# Patient Record
Sex: Male | Born: 2000 | Hispanic: No | Marital: Single | Smoking: Never smoker
Health system: Southern US, Community
[De-identification: ages and names within clinical notes are randomized; demographics above are authoritative.]

## PROBLEM LIST (undated history)

## (undated) DIAGNOSIS — J309 Allergic rhinitis, unspecified: Secondary | ICD-10-CM

## (undated) DIAGNOSIS — R062 Wheezing: Secondary | ICD-10-CM

## (undated) HISTORY — DX: Allergic rhinitis, unspecified: J30.9

## (undated) HISTORY — DX: Wheezing: R06.2

---
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
## 2018-02-28 ENCOUNTER — Ambulatory Visit (INDEPENDENT_AMBULATORY_CARE_PROVIDER_SITE_OTHER): Payer: Self-pay | Admitting: Licensed Clinical Social Worker

## 2018-02-28 ENCOUNTER — Encounter: Payer: Self-pay | Admitting: Pediatrics

## 2018-02-28 ENCOUNTER — Ambulatory Visit (INDEPENDENT_AMBULATORY_CARE_PROVIDER_SITE_OTHER): Payer: Medicaid Other | Admitting: Pediatrics

## 2018-02-28 VITALS — BP 114/70 | HR 65 | Ht 70.25 in | Wt 165.6 lb

## 2018-02-28 DIAGNOSIS — Z68.41 Body mass index (BMI) pediatric, 5th percentile to less than 85th percentile for age: Secondary | ICD-10-CM

## 2018-02-28 DIAGNOSIS — Z0000 Encounter for general adult medical examination without abnormal findings: Secondary | ICD-10-CM

## 2018-02-28 DIAGNOSIS — M25562 Pain in left knee: Secondary | ICD-10-CM

## 2018-02-28 DIAGNOSIS — M25561 Pain in right knee: Secondary | ICD-10-CM | POA: Diagnosis not present

## 2018-02-28 DIAGNOSIS — Z113 Encounter for screening for infections with a predominantly sexual mode of transmission: Secondary | ICD-10-CM | POA: Diagnosis not present

## 2018-02-28 DIAGNOSIS — G8929 Other chronic pain: Secondary | ICD-10-CM | POA: Diagnosis not present

## 2018-02-28 DIAGNOSIS — Z1331 Encounter for screening for depression: Secondary | ICD-10-CM

## 2018-02-28 LAB — POCT RAPID HIV: Rapid HIV, POC: NEGATIVE

## 2018-02-28 NOTE — Progress Notes (Signed)
Adolescent Well Care Visit Harold Fernandez is a 17 y.o. male who is here for well care.    PCP:  Clifton Custard, MD   History was provided by the patient.  Current Issues: Current concerns include knee pain.   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 Plans [employer] next year for construction management.  Confidential Social History: Tobacco?  no Secondhand smoke exposure?  no Drugs/ETOH?  no  Sexually Active?  no   Pregnancy Prevention: abstinence, condoms  Safe at home, in school & in relationships?  Yes Safe to self?  Yes   Screenings: Patient has a dental home: yes  The patient completed the Rapid Assessment for Adolescent Preventive Services screening questionnaire and the following topics were identified as risk factors and discussed: helmet use  In addition, the following topics were discussed as part of anticipatory guidance tobacco use, marijuana use, condom use, birth control and alcohol avoidance.  PHQ-9 completed and results indicated no signs of depression  Physical Exam:  Vitals:   02/28/18 1506  BP: 114/70  Pulse: 65  SpO2: 96%  Weight: 165 lb 9.6 oz (75.1 kg)  Height: 5' 10.25" (1.784 m)   BP 114/70 (BP Location: Right Arm, Patient Position: Sitting, Cuff Size: Normal)   Pulse 65   Ht 5' 10.25" (1.784 m)   Wt 165 lb 9.6 oz (75.1 kg)   SpO2 96%   BMI 23.59 kg/m  Body mass index: body  mass index is 23.59 kg/m.    Hearing Screening   Method: Audiometry   125Hz  250Hz  500Hz  1000Hz  2000Hz  3000Hz  4000Hz  6000Hz  8000Hz   Right ear:   40 20 20  20     Left ear:   20 20 20  20       Visual Acuity Screening   Right eye Left eye Both eyes  Without correction: 10/10 10/10 10/10   With correction:       General Appearance:   alert, oriented, no acute distress and well nourished  HENT: Normocephalic, no obvious abnormality, conjunctiva clear  Mouth:   Normal appearing teeth, no obvious discoloration, dental caries, or dental caps  Neck:   Supple; thyroid: no enlargement, symmetric, no tenderness/mass/nodules  Chest Normal male  Lungs:   Clear to auscultation bilaterally, normal work of breathing  Heart:   Regular rate and rhythm, S1 and S2 normal, no murmurs;   Abdomen:   Soft, non-tender, no mass, or organomegaly  GU normal male genitals, no testicular masses or hernia, Tanner stage V, very mild left varicocele  Musculoskeletal:   Tone and strength strong and symmetrical, all extremities, normal exam of both knees no swelling, tenderness or effusions.  Normal ROM, anterior drawer, and medial/lateral stress testing.              Lymphatic:   No cervical adenopathy  Skin/Hair/Nails:   Skin warm, dry and intact, no rashes, no bruises or petechiae  Neurologic:   Strength, gait, and coordination normal and age-appropriate  Assessment and Plan:   Healthy 17 year old male.    Very mild left varicocele - discussed with patient  Chronic bilateral knee pain - normal exam today in clinic.  Pain described as along the upper aspect of both patellas.  Possible patellofemoral sydrome vs. Tendonitis.  Recommend quadriceps stretching.  Return precautions reviewed.    BMI is appropriate for age  Hearing screening result:normal except for 40 db at 500 Hz in the right ear, no hearing concerns.  Plan to rescreen in 1 year Vision screening result: normal   Return today (on 02/28/2018) for  17 year old PE in 1 year with Dr. Luna FuseEttefagh.Clifton Custard.  Kate Scott Ettefagh, MD

## 2018-02-28 NOTE — BH Specialist Note (Signed)
Integrated Behavioral Health Initial Visit  MRN: [id number] Name: Harold Fernandez  Number of Integrated Behavioral Health Clinician visits:: 1/6 Session Start time: 3:17  Session End time: 3:23 Total time: 6 mins; no charge to brief visit  Type of Service: Integrated Behavioral Health- Individual/Family Interpretor:No. Interpretor Name and Language: n/a   Warm Hand Off Completed.       SUBJECTIVE: Harold Fernandez is a 17 y.o. male accompanied by self Patient was referred by Dr. Luna FuseEttefagh for PHQ Review.  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

## 2018-03-01 LAB — C. TRACHOMATIS/N. GONORRHOEAE RNA
C. TRACHOMATIS RNA, TMA: NOT DETECTED
N. GONORRHOEAE RNA, TMA: NOT DETECTED

## 2018-03-10 ENCOUNTER — Other Ambulatory Visit: Payer: Self-pay

## 2018-03-10 ENCOUNTER — Encounter: Payer: Self-pay | Admitting: Pediatrics

## 2018-03-10 ENCOUNTER — Ambulatory Visit (INDEPENDENT_AMBULATORY_CARE_PROVIDER_SITE_OTHER): Payer: Medicaid Other | Admitting: Pediatrics

## 2018-03-10 VITALS — Temp 97.4°F | Wt 164.6 lb

## 2018-03-10 DIAGNOSIS — J029 Acute pharyngitis, unspecified: Secondary | ICD-10-CM | POA: Diagnosis not present

## 2018-03-10 LAB — POCT RAPID STREP A (OFFICE): RAPID STREP A SCREEN: NEGATIVE

## 2018-03-10 NOTE — Progress Notes (Signed)
Subjective:     Harold Fernandez, is a 17 y.o. male   History provider by patient No interpreter necessary.  Chief Complaint  Patient presents with  . Sore Throat    UTD shots and urine sti testing. c/o throat pain since Friday, difficult to eat, had trouble swallowing saliva. using cepacol.   . Fever    felt hot first day of illness. using motrin.     HPI: Harold Fernandez presents for evaluation of sore throat. Reports he was doing well until Friday 6/14 when he started to have throat pain after coming home from work. The pain was worst that night and the next day he went to a pharmacy and was instructed to start use of ibuprofen and Cepacol to help with pain. He has continued those during the weekend once daily and will feel some relief, however the pain returns. Overall feels it is better than Friday. Possibly had subjective temp on Friday evening but none since then. Has mild dry cough, but no rhinorrhea, congestion, rash, diarrhea, vomiting or other symptoms. No known sick contacts. 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 No h/o sexual activity.   Review of Systems  Constitutional: Negative for activity change, appetite change and fever.  HENT: Positive for sore throat. Negative for congestion, mouth sores, rhinorrhea and sneezing.   Eyes: Negative for redness.  Respiratory: Positive for cough (mild). Negative for shortness of breath.   Gastrointestinal: Negative for abdominal distention, abdominal pain, diarrhea and vomiting.  Genitourinary: Negative for decreased urine volume.  Musculoskeletal: Negative for neck stiffness.  Skin: Negative for rash.  Neurological: Negative for headaches.     Patient's history was reviewed and updated as appropriate: allergies, current medications, past family history, past medical history, past social history, past surgical  history and problem list.     Objective:     Temp (!) 97.4 F (36.3 C) (Temporal)   Wt 164 lb 9.6 oz (74.7 kg)   BMI 23.45 kg/m   Physical Exam  Constitutional: He is oriented to person, place, and time. He appears well-developed and well-nourished. No distress.  HENT:  Head: Normocephalic.  Right Ear: Tympanic membrane and ear canal normal.  Left Ear: Tympanic membrane and ear canal normal.  Mouth/Throat: Mucous membranes are normal. No oral lesions. Posterior oropharyngeal erythema present. No tonsillar exudate.  Eyes: Pupils are equal, round, and reactive to light. EOM are normal.  Neck: Normal range of motion. Neck supple.  Cardiovascular: Normal rate, regular rhythm and intact distal pulses.  Pulmonary/Chest: Effort normal and breath sounds normal. He has no wheezes.  Lymphadenopathy:    He has no cervical adenopathy.  Neurological: He is alert and oriented to person, place, and time.  Skin: Skin is warm. Capillary refill takes less than 2 seconds.       Assessment & Plan:   1. Pharyngitis, unspecified etiology: Mild pharyngeal erythema with no exudate or LAD. Lungs CTAB and no other URI symptoms. Rapid strep testing negative; with lack of fever or other findings, do not feel culture is necessary. Possible viral pharyngitis which is resolving, versus irritation from particle exposure at work. Continue supportive care with ibuprofen/Tylenol prn and pain control measures. Return precautions for new/worsening pain, difficulty with PO, fevers or respiratory symptoms reviewed.  - POCT rapid strep A (negative)  Return if symptoms worsen or fail to improve.  [employer]  Alinda Money, MD

## 2018-03-10 NOTE — Patient Instructions (Addendum)
Your strep test was negative today, which means you do not have strep throat. You can continue to take the ibuprofen 400 mg every 6-8 hours as needed for pain. You can also take Tyelnol 500 mg every 6 hours as needed between doses of ibuprofen if needed to help. Make sure to drink plenty of water while you are taking these medications and make sure you do not get dehydrated. If you start to have fevers, have worsening pain, feel like there is drainage in your throat, have trouble drinking any fluids or any other new symptoms, please call us to be seen again.   Sore Throat A sore throat is pain, burning, irritation, or scratchiness in the throat. When you have a sore throat, you may feel pain or tenderness in your throat when you swallow or talk. Many things can cause a sore throat, including:  An infection.  Seasonal allergies.  Dryness in the air.  Irritants, such as smoke or pollution.  Gastroesophageal reflux disease (GERD).  A tumor.  A sore throat is often the first sign of another sickness. It may happen with other symptoms, such as coughing, sneezing, fever, and swollen neck glands. Most sore throats go away without medical treatment. Follow these instructions at home:  Take over-the-counter medicines only as told by your health care provider.  Drink enough fluids to keep your urine clear or pale yellow.  Rest as needed.  To help with pain, try: ? Sipping warm liquids, such as broth, herbal tea, or warm water. ? Eating or drinking cold or frozen liquids, such as frozen ice pops. ? Gargling with a salt-water mixture 3-4 times a day or as needed. To make a salt-water mixture, completely dissolve -1 tsp of salt in 1 cup of warm water. ? Sucking on hard candy or throat lozenges. ? Putting a cool-mist humidifier in your bedroom at night to moisten the air. ? Sitting in the bathroom with the door closed for 5-10 minutes while you run hot water in the shower.  Do not use any  tobacco products, such as cigarettes, chewing tobacco, and e-cigarettes. If you need help quitting, ask your health care provider. Contact a health care provider if:  You have a fever for more than 2-3 days.  You have symptoms that last (are persistent) for more than 2-3 days.  Your throat does not get better within 7 days.  You have a fever and your symptoms suddenly get worse. Get help right away if:  You have difficulty breathing.  You cannot swallow fluids, soft foods, or your saliva.  You have increased swelling in your throat or neck.  You have persistent nausea and vomiting. This information is not intended to replace advice given to you by your health care provider. Make sure you discuss any questions you have with your health care provider. Document Released: 10/18/2004 Document Revised: 05/06/2016 Document Reviewed: 07/01/2015 Elsevier Interactive Patient Education  [employer]2018 Elsevier Inc.

## 2018-07-02 ENCOUNTER — Other Ambulatory Visit: Payer: Self-pay

## 2018-07-02 ENCOUNTER — Encounter: Payer: Self-pay | Admitting: Pediatrics

## 2018-07-02 ENCOUNTER — Ambulatory Visit (INDEPENDENT_AMBULATORY_CARE_PROVIDER_SITE_OTHER): Payer: Medicaid Other | Admitting: Pediatrics

## 2018-07-02 VITALS — Temp 98.6°F | Wt 172.0 lb

## 2018-07-02 DIAGNOSIS — Z23 Encounter for immunization: Secondary | ICD-10-CM | POA: Diagnosis not present

## 2018-07-02 DIAGNOSIS — L739 Follicular disorder, unspecified: Secondary | ICD-10-CM

## 2018-07-02 MED ORDER — MUPIROCIN 2 % EX OINT: 1.0000 "application " | TOPICAL_OINTMENT | Freq: Three times a day (TID) | CUTANEOUS | 0 refills | Status: AC

## 2018-07-02 NOTE — Patient Instructions (Signed)

## 2018-07-02 NOTE — Progress Notes (Signed)
Subjective:    Tannor is a 17 y.o. old male here with his mother for other (bump on the back of his neck since last Friday, at this time it does not hurt or itch ) .    No interpreter necessary.  HPI   This 17 year old presents with knot on the back of his neck that was originally painful but now is not. It has drained a little pus. No itching. No fever.   Review of Systems  Constitutional: Negative for activity change and fever.  Skin: Positive for rash.    History and Problem List: Concepcion has Allergic conjunctivitis and rhinitis; Acne vulgaris; and History of ankle sprain on their problem list.  Taz  has a past medical history of Allergic rhinitis (2006) and Wheezing (09/2004).  Immunizations needed: Flu shot today.      Objective:    Temp 98.6 F (37 C) (Oral)   Wt 172 lb (78 kg)   BMI 24.50 kg/m  Physical Exam  Constitutional: He appears well-developed. No distress.  Cardiovascular: Normal rate and regular rhythm.  No murmur heard. Pulmonary/Chest: Effort normal and breath sounds normal.  Skin:  indurated but NT and non fluctuant 1 cm mass posterior neck in hair line. Dark scab on surface       Assessment and Plan:   Daishon is a 17 y.o. old male with boil that has drained..  1. Folliculitis Discussed return precautions.   Warm Compresses.   - mupirocin ointment (BACTROBAN) 2 %; Apply 1 application topically 3 (three) times daily for 7 days.  Dispense: 22 g; Refill: 0  2. Need for vaccination Counseling provided on all components of vaccines given today and the importance of receiving them. All questions answered.Risks and benefits reviewed and guardian consents.  - Flu Vaccine QUAD 36+ mos IM    Return if symptoms worsen or fail to improve.  Kalman Jewels, MD

## 2018-07-08 ENCOUNTER — Ambulatory Visit: Payer: Medicaid Other

## 2018-10-09 NOTE — Progress Notes (Signed)
Subjective:    Harold Fernandez is a 18 y.o. old male here with his father for drug testing and STI screening (Parents of patient would like patient to have drug and STD testing.) .  Last well teen check was in June 2019.  HPI Patient comes with father for STD and drug testing. Interview conducted partially with father and partially alone with patient.  [phone] home phone, [phone] cell phone of patient.  If no positive results, call home phone first. If anything is positive, call patient's cell phone first.   Patient reports that parents are demanding drug and STI testing in order for him to stay living under their roof. Father walked in on him during an intimate moment with a male partner and also saw messages on the patient's phone re: marijuana.   Patient reports that he was last sexually active about 2-3 months ago. Has only had one lifetime male partner, no concerns for STDs in that individual. Patient reports no discharge, pain with urination, no rash. No lesions or lumps in genitals.  No oral pain or ulcers. No fevers, chills, headaches, difficulty breathing. No condom use at tim of sex. Has never has STD testing.  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 Last use 6 mo before. Maybe uses a couple of times a year. No cocaine, meth, heroin, or other drug use. Alcohol maybe once every couple of months. No vaping.    Review of Systems negative except where noted above  History and Problem List: Harold Fernandez has Allergic conjunctivitis and rhinitis; Acne vulgaris; and History of ankle sprain on their problem list.  Harold Fernandez  has a past medical history of Allergic rhinitis (2006) and Wheezing (09/2004).  Immunizations needed: none     Objective:    BP 128/76   Pulse 64   Temp 98.1 F (36.7 C) (Oral)   Wt 178 lb (80.7 kg)   BMI 25.36 kg/m  Physical Exam Vitals signs and nursing note reviewed. Exam conducted with a chaperone present.  Constitutional:    General: He is not in acute distress.    Appearance: He is normal weight. He is not ill-appearing.  HENT:     Head: Normocephalic.     Nose: Nose normal.     Mouth/Throat:     Mouth: Mucous membranes are moist.     Pharynx: No oropharyngeal exudate or posterior oropharyngeal erythema.  Eyes:     General:        Right eye: No discharge.        Left eye: No discharge.     Conjunctiva/sclera: Conjunctivae normal.     Pupils: Pupils are equal, round, and reactive to light.  Neck:     Musculoskeletal: Normal range of motion.  Cardiovascular:     Rate and Rhythm: Normal rate and regular rhythm.     Heart sounds: No murmur.  Pulmonary:     Effort: Pulmonary effort is normal.     Breath sounds: Normal breath sounds. No wheezing, rhonchi or rales.  Abdominal:     General: Abdomen is flat. Bowel sounds are normal.     Tenderness: There is no abdominal tenderness.  Genitourinary:    Penis: Normal and circumcised. No erythema, tenderness, discharge, swelling or lesions.      Scrotum/Testes: Normal.        Right: Mass, tenderness, swelling, testicular hydrocele or varicocele not present.        Left: Mass, tenderness, swelling, testicular hydrocele or varicocele not  present.     Epididymis:     Right: Normal. Not enlarged. No tenderness.     Left: Normal. Not enlarged. No tenderness.  Lymphadenopathy:     Cervical: No cervical adenopathy.  Skin:    General: Skin is warm.     Capillary Refill: Capillary refill takes less than 2 seconds.  Neurological:     General: No focal deficit present.     Mental Status: He is alert.  Psychiatric:        Mood and Affect: Mood normal.        Behavior: Behavior normal.        Thought Content: Thought content normal.        Assessment and Plan:     Harold Fernandez was seen today for drug testing and STI screening (Parents of patient would like patient to have drug and STD testing.) . Very low concern for STI based on history and exam, though will perform  testing as recommended. Confirmed that the patient would like to pursue drug testing at this time. Informed him that, as an adult, he has the right to confidentiality of all of his medical records. If no positive results, we are to call home phone number and have permission to update his parents. If any positive results, we are to call patient first so that he may notify his parents. Safe sex and drug use counseling provided. Condoms provided. Return for next well check.    Problem List Items Addressed This Visit    None    Visit Diagnoses    Encounter for drug screening    -  Primary   Relevant Orders   Pain Mgmt, Profile 1 w/o Conf, U   Counseling for sexually transmitted disease       Screen for STD (sexually transmitted disease)       Relevant Orders   C. trachomatis/N. gonorrhoeae RNA   HIV Antibody (routine testing w rflx)   RPR      Return if symptoms worsen or fail to improve, for next due annual physical .  Irene Shipper, MD

## 2018-10-10 ENCOUNTER — Encounter: Payer: Self-pay | Admitting: Pediatrics

## 2018-10-10 ENCOUNTER — Ambulatory Visit (INDEPENDENT_AMBULATORY_CARE_PROVIDER_SITE_OTHER): Payer: Medicaid Other | Admitting: Pediatrics

## 2018-10-10 VITALS — BP 128/76 | HR 64 | Temp 98.1°F | Wt 178.0 lb

## 2018-10-10 DIAGNOSIS — Z0283 Encounter for blood-alcohol and blood-drug test: Secondary | ICD-10-CM

## 2018-10-10 DIAGNOSIS — Z113 Encounter for screening for infections with a predominantly sexual mode of transmission: Secondary | ICD-10-CM | POA: Diagnosis not present

## 2018-10-10 DIAGNOSIS — Z708 Other sex counseling: Secondary | ICD-10-CM

## 2018-10-10 NOTE — Patient Instructions (Signed)
We will call you with results in the next few days. You can have your parents come in to get lab reports, if that is what they would like.

## 2018-10-11 LAB — PAIN MGMT, PROFILE 1 W/O CONF, U
Amphetamines: NEGATIVE ng/mL (ref <500)
Barbiturates: NEGATIVE ng/mL (ref <300)
Benzodiazepines: NEGATIVE ng/mL (ref <100)
Cocaine Metabolite: NEGATIVE ng/mL (ref <150)
Creatinine: 53 mg/dL
MARIJUANA METABOLITE: NEGATIVE ng/mL (ref <20)
Methadone Metabolite: NEGATIVE ng/mL (ref <100)
OPIATES: NEGATIVE ng/mL (ref <100)
OXIDANT: NEGATIVE ug/mL (ref <200)
OXYCODONE: NEGATIVE ng/mL (ref <100)
Phencyclidine: NEGATIVE ng/mL (ref <25)
pH: 6.7 (ref 4.5–9.0)

## 2018-10-11 LAB — C. TRACHOMATIS/N. GONORRHOEAE RNA
C. TRACHOMATIS RNA, TMA: NOT DETECTED
N. GONORRHOEAE RNA, TMA: NOT DETECTED

## 2018-10-12 LAB — RPR: RPR: NONREACTIVE

## 2018-10-12 LAB — HIV ANTIBODY (ROUTINE TESTING W REFLEX): HIV 1&2 Ab, 4th Generation: NONREACTIVE

## 2018-10-27 ENCOUNTER — Ambulatory Visit (INDEPENDENT_AMBULATORY_CARE_PROVIDER_SITE_OTHER): Payer: Medicaid Other | Admitting: Pediatrics

## 2018-10-27 ENCOUNTER — Encounter: Payer: Self-pay | Admitting: Pediatrics

## 2018-10-27 ENCOUNTER — Other Ambulatory Visit: Payer: Self-pay

## 2018-10-27 VITALS — Temp 98.3°F | Wt 178.4 lb

## 2018-10-27 DIAGNOSIS — M25562 Pain in left knee: Secondary | ICD-10-CM | POA: Diagnosis not present

## 2018-10-27 MED ORDER — IBUPROFEN 600 MG PO TABS: ORAL_TABLET | ORAL | 1 refills | Status: DC

## 2018-10-27 NOTE — Patient Instructions (Signed)

## 2018-10-27 NOTE — Progress Notes (Signed)
  Subjective:     Patient ID: Harold Fernandez, male   DOB: Jul 30, 2001, 18 y.o.   MRN: [id number]  HPI:  18 year old male in by himself.  Has had pain in left knee for past 10-14 days.  Denies any injury to area.  Points to top, lateral edge of patella as area of pain.  Has not had redness or swelling to area.  Able to walk and climb steps with no increase in pain.  Does not feel like knee is "giving out".  Occasionally plays soccer.  Is a Consulting civil engineer at [employer]   Review of Systems:  Non-contributory except as mentioned in HPI     Objective:   Physical Exam Vitals signs and nursing note reviewed.  Constitutional:      General: He is not in acute distress.    Appearance: Normal appearance. He is normal weight.  Musculoskeletal: Normal range of motion.        General: No swelling, tenderness, deformity or signs of injury.     Comments: Has full flexion and extension of knee.  Able to do squats and balance on one leg (left).  No patellar laxity.  Neurological:     Mental Status: He is alert.        Assessment:     Acute left knee pain- R/O ilial-tibial band syndrome     Plan:     Rx per orders for Motrin.  Take every 6 hours for next 3-4 days and then prn  Referral to Sports Medicine.   Gregor Hams, PPCNP-BC

## 2018-10-29 ENCOUNTER — Ambulatory Visit (INDEPENDENT_AMBULATORY_CARE_PROVIDER_SITE_OTHER): Payer: Medicaid Other | Admitting: Sports Medicine

## 2018-10-29 ENCOUNTER — Ambulatory Visit: Payer: Self-pay

## 2018-10-29 VITALS — BP 120/78 | Ht 70.5 in | Wt 178.0 lb

## 2018-10-29 DIAGNOSIS — M25562 Pain in left knee: Secondary | ICD-10-CM | POA: Diagnosis not present

## 2018-10-29 MED ORDER — DICLOFENAC SODIUM 75 MG PO TBEC: 75.0000 mg | DELAYED_RELEASE_TABLET | Freq: Two times a day (BID) | ORAL | 0 refills | Status: DC

## 2018-10-29 NOTE — Progress Notes (Signed)
HPI  CC: Left knee pain  Harold Fernandez is a 18 year old male who presents for left knee pain.  He states the pain is been present for around 1 week.  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  He does not do any deep leg workouts.  He states he is not member any [employer] inciting event for the knee.  He states that playing soccer does not bother it.  He states he is able to play a full game without any issue.  He states the pain is worse when he sits with his knee bent.  He states he notices it mostly when he sitting in class in a chair.  He states that extending his knee helps with the pain.  He states there was initially some swelling in the lateral aspect of his knee, but this is since gone away.  He states the pain has gotten significantly better.  He denies any numbness and tingling.  Denies any weakness.  He denies any locking or catching of his knee.  He has no prior injuries to that knee.  Past Injuries: None Past Surgeries: None Smoking: Denies Family Hx: Noncontributory  ROS: Per HPI; in addition no fever, no rash, no additional weakness, no additional numbness, no additional paresthesias, and no additional falls/injury.   Objective: BP 120/78   Ht 5' 10.5" (1.791 m)   Wt 178 lb (80.7 kg)   BMI 25.18 kg/m  Gen: Right-Hand Dominant. NAD, well groomed, a/o x3, normal affect.  CV: Well-perfused. Warm.  Resp: Non-labored.  Neuro: Sensation intact throughout. No gross coordination deficits.  Gait: Nonpathologic posture, unremarkable stride without signs of limp or balance issues.  Left knee exam: No erythema, warmth, swelling noted.  Tenderness palpation over the superolateral aspect of the patella.  Full range of motion of the knee in flexion extension.  No pain with range of motion.  Strength out of 5 throughout testing.  Negative Lockman, negative posterior drawer, negative varus stress test.  Some laxity noticed and valgus stress test.  Negative McMurray  test.  ULTRASOUND: Knee, left Diagnostic limited ultrasound imaging obtained of patient's left knee.  - Quadriceps tendon: No appreciated signs of tearing, edema, or calcification.  Fluid noted in the lateral aspect of the suprapatellar pouch with small calcifications present within the fluid.   - Medial joint line: No signs concerning for meniscal pathology appreciated. No increased fluid presence noted. No evidence of osteophyte development or significant joint space loss.  - Lateral joint line: No signs concerning for meniscal pathology appreciated. No increased fluid presence noted. No evidence of osteophyte development or significant joint space loss.   IMPRESSION: findings consistent with inflammation of the lateral compartment of knee.  Assessment and Plan: Left knee pain, likely strain of the vastus lateralis.  We discussed treatment options at today's visit.  I recommend he start a compression sleeve over the knee today.  I will also start him on diclofenac at today's visit.  On ultrasound I did see some calcifications and fluid on the lateral aspect of his knee.  This is likely secondary to a strain of the quad muscle, but could be a sign of gout.  We will try him the anti-inflammatories and compression and see him back in 1 week.  I will consider doing an MRI if the pain gets worse over that time.  Harold Quan, MD [facility] Sports Medicine Fellow 10/29/2018 12:30 PM  I was the preceptor for this visit and  available for immediate consultation.  Patient's ultrasound shows a small pocket of fluid in the lateral suprapatellar pouch with small crystal deposits which are freely floating.  Since the patient is improving clinically, we will simply put him on a short course of diclofenac and see him back in the office in 1 week for reevaluation.  Patient is encouraged to call with questions or concerns prior to that follow-up visit. Harold Aris, DO

## 2018-10-30 ENCOUNTER — Encounter: Payer: Self-pay | Admitting: Sports Medicine

## 2019-03-12 ENCOUNTER — Telehealth: Payer: Self-pay | Admitting: Pediatrics

## 2019-03-12 NOTE — Telephone Encounter (Signed)
Pre-screening for in-office visit ° °1. Who is bringing the patient to the visit? Self ° °Informed only one adult can bring patient to the visit to limit possible exposure to COVID19. And if they have a face mask to wear it. ° °2. Has the person bringing the patient or the patient had contact with anyone with suspected or confirmed COVID-19 in the last 14 days? No  ° °3. Has the person bringing the patient or the patient had any of these symptoms in the last 14 days? No  ° °Fever (temp 100 F or higher) °Difficulty breathing °Cough °Sore throat °Body aches °Chills °Vomiting °Diarrhea ° ° °If all answers are negative, advise patient to call our office prior to your appointment if you or the patient develop any of the symptoms listed above. °  °If any answers are yes, cancel in-office visit and schedule the patient for a same day telehealth visit with a provider to discuss the next steps. ° °

## 2019-03-13 ENCOUNTER — Encounter: Payer: Self-pay | Admitting: Pediatrics

## 2019-03-13 ENCOUNTER — Other Ambulatory Visit: Payer: Self-pay

## 2019-03-13 ENCOUNTER — Ambulatory Visit (INDEPENDENT_AMBULATORY_CARE_PROVIDER_SITE_OTHER): Payer: Medicaid Other | Admitting: Pediatrics

## 2019-03-13 VITALS — BP 114/70 | HR 68 | Ht 70.5 in | Wt 178.8 lb

## 2019-03-13 DIAGNOSIS — Z68.41 Body mass index (BMI) pediatric, 85th percentile to less than 95th percentile for age: Secondary | ICD-10-CM

## 2019-03-13 DIAGNOSIS — Z113 Encounter for screening for infections with a predominantly sexual mode of transmission: Secondary | ICD-10-CM

## 2019-03-13 DIAGNOSIS — Z0000 Encounter for general adult medical examination without abnormal findings: Secondary | ICD-10-CM

## 2019-03-13 NOTE — Progress Notes (Signed)
Adolescent Well Care Visit Harold Fernandez is a 18 y.o. male who is here for well care.    PCP:  Carmie End, MD   History was provided by the patient.  Confidentiality was discussed with the patient and, if applicable, with caregiver as well. Patient's personal or confidential phone number:  [phone]   Current Issues: Current concerns include: none   Nutrition: Nutrition/Eating Behaviors: "eats too much" - veggies, meats  Adequate calcium in diet?: yes Supplements/ Vitamins: no   Exercise/ Media: Play any Sports?/ Exercise: playing soccer, weight training (gym). Running 2 miles daily, daily weight workouts  Screen Time:  > 2 hours-counseling provided Media Rules or Monitoring?: no  Sleep:  Sleep: 10:30- 6:30-7. On days off gets 12 hours   Social Screening: Lives with:  Mom and dad  Parental relations:  good Activities, Work, and Research officer, political party?: yes, helps around the house  Concerns regarding behavior with peers?  no Stressors of note: has a job offer but delayed due to [employer]   Education: School Name: [employer] Grade: college School performance: doing well; no concerns School Behavior: doing well; no concerns  Confidential Social History: Tobacco?  Yes- vapes 2-3 days a week. Counseling provided  Secondhand smoke exposure?  no Drugs/ETOH?  no  Sexually Active?  yes   Pregnancy Prevention: condoms  Safe at home, in school & in relationships?  Yes Safe to self?  Yes   Screenings: Patient has a dental home: yes  The patient completed the Rapid Assessment for Adolescent Preventive Services screening questionnaire and the following topics were identified as risk factors and discussed: healthy eating, exercise, tobacco use and condom use    PHQ-9 completed and results indicated no concerns (PHQ-3- related to eating, fatigue)  Physical Exam:  Vitals:   03/13/19 1138  BP: 114/70  Pulse: 68  SpO2: 98%  Weight: 178  lb 12.8 oz (81.1 kg)  Height: 5' 10.5" (1.791 m)   BP 114/70 (BP Location: Right Arm, Patient Position: Sitting, Cuff Size: Normal)   Pulse 68   Ht 5' 10.5" (1.791 m)   Wt 178 lb 12.8 oz (81.1 kg)   SpO2 98%   BMI 25.29 kg/m  Body mass index: body mass index is 25.29 kg/m. Blood pressure percentiles are not available for patients who are 18 years or older.   Hearing Screening   Method: Audiometry   125Hz  250Hz  500Hz  1000Hz  2000Hz  3000Hz  4000Hz  6000Hz  8000Hz   Right ear:   25 25 20  20     Left ear:   20 20 20  20       Visual Acuity Screening   Right eye Left eye Both eyes  Without correction: 20/20 20/20   With correction:       General Appearance:   alert, oriented, no acute distress and well nourished  HENT: Normocephalic, no obvious abnormality, conjunctiva clear  Mouth:   Normal appearing teeth, no obvious discoloration, dental caries, or dental caps  Neck:   Supple; thyroid: no enlargement, symmetric, no tenderness/mass/nodules  Chest normal  Lungs:   Clear to auscultation bilaterally, normal work of breathing  Heart:   Regular rate and rhythm, S1 and S2 normal, no murmurs;   Abdomen:   Soft, non-tender, no mass, or organomegaly  GU genitalia not examined  Musculoskeletal:   Tone and strength strong and symmetrical, all extremities               Lymphatic:   No cervical adenopathy  Skin/Hair/Nails:  Skin warm, dry and intact, no rashes, no bruises or petechiae  Neurologic:   Strength, gait, and coordination normal and age-appropriate     Assessment and Plan:   18 [facility], doing well, healthy. No concerns today. Requested lipid panel for screening. Reviewed general healthy eating, exercise, and future career. Discussed transition of care to adult providers and provided information for [facility]  1. Encounter for general adult medical examination without abnormal findings - Lipid panel  2. Body mass index, pediatric, 85th  percentile to less than 95th percentile for age - healthy habits-- daily exercise, eats well.   3. Screening examination for venereal disease - POCT Rapid HIV  4. Routine screening for STI (sexually transmitted infection) - C. trachomatis/N. gonorrhoeae RNA  Provided information to transition care   Lelan Ponsaroline Newman, MD

## 2019-03-13 NOTE — Patient Instructions (Addendum)
[facility]   Address: [address], [place], [place] [address] Hours:  Closes soon ? 12:30PM ? Reopens 1:30PM    Phone: [phone]  Please call to transition you rcare!  Keep up all the good work!! Good luck with school, job, fitness, and life!

## 2019-03-14 LAB — LIPID PANEL
Cholesterol: 139 mg/dL (ref <170)
HDL: 53 mg/dL (ref >45)
LDL Cholesterol (Calc): 72 mg/dL (calc) (ref <110)
Non-HDL Cholesterol (Calc): 86 mg/dL (calc) (ref <120)
Total CHOL/HDL Ratio: 2.6 (calc) (ref <5.0)
Triglycerides: 61 mg/dL (ref <90)

## 2019-03-14 LAB — C. TRACHOMATIS/N. GONORRHOEAE RNA
C. trachomatis RNA, TMA: NOT DETECTED
N. gonorrhoeae RNA, TMA: NOT DETECTED

## 2019-04-14 ENCOUNTER — Telehealth: Payer: Self-pay

## 2019-04-14 NOTE — Telephone Encounter (Signed)
Please notify patient that his labs were normal (lipid panel and GC/Chlamydia test).

## 2019-04-14 NOTE — Telephone Encounter (Signed)
Patient is requesting results from recent labs.

## 2019-04-14 NOTE — Telephone Encounter (Signed)
Results given to patient

## 2019-06-18 ENCOUNTER — Telehealth: Payer: Self-pay | Admitting: Pediatrics

## 2019-06-18 NOTE — Telephone Encounter (Signed)

## 2019-06-19 ENCOUNTER — Ambulatory Visit (INDEPENDENT_AMBULATORY_CARE_PROVIDER_SITE_OTHER): Payer: Medicaid Other | Admitting: *Deleted

## 2019-06-19 ENCOUNTER — Ambulatory Visit: Payer: Medicaid Other

## 2019-06-19 DIAGNOSIS — Z23 Encounter for immunization: Secondary | ICD-10-CM

## 2019-09-21 ENCOUNTER — Telehealth: Payer: Self-pay

## 2019-09-21 NOTE — Telephone Encounter (Signed)
Pre-screening for onsite visit  1. Who is bringing the patient to the visit?Patient is not sure if mom was coming along  Informed only one adult can bring patient to the visit to limit possible exposure to COVID19 and facemasks must be worn while in the building by the patient (ages 8 and older) and adult.  2. Has the person bringing the patient or the patient been around anyone with suspected or confirmed COVID-19 in the last 14 days? No   3. Has the person bringing the patient or the patient been around anyone who has been tested for COVID-19 in the last 14 days? No  4. Has the person bringing the patient or the patient had any of these symptoms in the last 14 days?No  Fever (temp 100 F or higher) Breathing problems Cough Sore throat Body aches Chills Vomiting Diarrhea   If all answers are negative, advise patient to call our office prior to your appointment if you or the patient develop any of the symptoms listed above.   If any answers are yes, cancel in-office visit and schedule the patient for a same day telehealth visit with a provider to discuss the next steps.

## 2019-09-22 ENCOUNTER — Encounter: Payer: Self-pay | Admitting: Pediatrics

## 2019-09-22 ENCOUNTER — Ambulatory Visit (INDEPENDENT_AMBULATORY_CARE_PROVIDER_SITE_OTHER): Payer: Medicaid Other | Admitting: Pediatrics

## 2019-09-22 ENCOUNTER — Other Ambulatory Visit: Payer: Self-pay

## 2019-09-22 VITALS — Wt 182.0 lb

## 2019-09-22 DIAGNOSIS — L988 Other specified disorders of the skin and subcutaneous tissue: Secondary | ICD-10-CM | POA: Diagnosis not present

## 2019-09-22 NOTE — Patient Instructions (Signed)
Pilonidal Cyst  A pilonidal cyst is a fluid-filled sac that forms beneath the skin near the tailbone, at the top of the crease of the buttocks (pilonidal area). If the cyst is not large and not infected, it may not cause any problems. If the cyst becomes irritated or infected, it may get larger and fill with pus. An infected cyst is called an abscess. A pilonidal abscess may cause pain and swelling, and it may need to be drained or removed. What are the causes? The cause of this condition is not always known. In some cases, a hair that grows into your skin (ingrown hair) may be the cause. What increases the risk? You are more likely to get a pilonidal cyst if you:  Are male.  Have lots of hair near the crease of the buttocks.  Are overweight.  Have a dimple near the crease of the buttocks.  Wear tight clothing.  Do not bathe or shower often.  Sit for long periods of time. What are the signs or symptoms? Signs and symptoms of a pilonidal cyst may include pain, swelling, redness, and warmth in the pilonidal area. Depending on how big the cyst is, you may be able to feel a lump near your tailbone. If your cyst becomes infected, symptoms may include:  Pus or fluid drainage.  Fever.  Pain, swelling, and redness getting worse.  The lump getting bigger. How is this diagnosed? This condition may be diagnosed based on:  Your symptoms and medical history.  A physical exam.  A blood test to check for infection.  Testing a pus sample, if applicable. How is this treated? If your cyst does not cause symptoms, you may not need any treatment. If your cyst bothers you or is infected, you may need a procedure to drain or remove the cyst. Depending on the size, location, and severity of your cyst, your health care provider may:  Make an incision in the cyst and drain it (incision and drainage).  Open and drain the cyst, and then stitch the wound so that it stays open while it heals  (marsupialization). You will be given instructions about how to care for your open wound while it heals.  Remove all or part of the cyst, and then close the wound (cyst removal). You may need to take antibiotic medicines before your procedure. Follow these instructions at home: Medicines  Take over-the-counter and prescription medicines only as told by your health care provider.  If you were prescribed an antibiotic medicine, take it as told by your health care provider. Do not stop taking the antibiotic even if you start to feel better. General instructions  Keep the area around your pilonidal cyst clean and dry.  If there is fluid or pus draining from your cyst: ? Cover the area with a clean bandage (dressing) as needed. ? Wash the area gently with soap and water. Pat the area dry with a clean towel. Do not rub the area because that may cause bleeding.  Remove hair from the area around the cyst only if your health care provider tells you to do this.  Do not wear tight pants or sit in one position for long periods at a time.  Keep all follow-up visits as told by your health care provider. This is important. Contact a health care provider if you have:  New redness, swelling, or pain.  A fever.  Severe pain. Summary  A pilonidal cyst is a fluid-filled sac that forms beneath the   skin near the tailbone, at the top of the crease of the buttocks (pilonidal area).  If the cyst becomes irritated or infected, it may get larger and fill with pus. An infected cyst is called an abscess.  The cause of this condition is not always known. In some cases, a hair that grows into your skin (ingrown hair) may be the cause.  If your cyst does not cause symptoms, you may not need any treatment. If your cyst bothers you or is infected, you may need a procedure to drain or remove the cyst. This information is not intended to replace advice given to you by your health care provider. Make sure you  discuss any questions you have with your health care provider. Document Released: 09/07/2000 Document Revised: 08/29/2017 Document Reviewed: 08/29/2017 Elsevier Patient Education  2020 Elsevier Inc.  

## 2019-09-22 NOTE — Progress Notes (Signed)
  Subjective:    Harold Fernandez is a 18 y.o. old male here with his mother for Mass (two little holes in between buttocks) .    HPI His younger sister has a history of an infected pilonidal cyst which required surgery.  He felt that he had a sore spot in the same area on his buttocks so he checked and noticed that there were 2 small holes in the gluteal cleft.  He has not noted any drainage from the holes or surrounding redness.  The area sometimes gets a little sore.  His mother reports that these holes were not present when he was a baby or young child.  Review of Systems  History and Problem List: Harold Fernandez has Allergic conjunctivitis and rhinitis; Acne vulgaris; History of ankle sprain; and Acute pain of left knee on their problem list.  Harold Fernandez  has a past medical history of Allergic rhinitis (2006) and Wheezing (09/2004).     Objective:    Wt 182 lb (82.6 kg)   BMI 25.75 kg/m  Physical Exam Constitutional:      Appearance: Normal appearance.  Skin:    Comments: There are 2 small (about 5 mm diameter) circular pits in the superior aspect of the gluteal cleft in the midline.  No redness or drainage.  No other pits, sinuses or skin changes noted in the gluteal cleft down to the level of the anus.  There is hair growth diffusely over the buttocks and within the gluteal cleft.    Neurological:     Mental Status: He is alert.        Assessment and Plan:   Harold Fernandez is a 18 y.o. 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  No signs of infection currently or history of infection.  Discussed with patient risk of infection in the future and option for surgical resection which carries a significant risk of recurrence.  Offered referral to surgeon to discuss this further which patient declines at this time.  Return precautions reviewed.    Return if symptoms worsen or fail to improve.  Carmie End, MD

## 2019-09-23 DIAGNOSIS — L988 Other specified disorders of the skin and subcutaneous tissue: Secondary | ICD-10-CM | POA: Insufficient documentation

## 2021-05-25 ENCOUNTER — Telehealth: Payer: Self-pay

## 2021-05-25 DIAGNOSIS — Z09 Encounter for follow-up examination after completed treatment for conditions other than malignant neoplasm: Secondary | ICD-10-CM

## 2021-05-25 NOTE — Telephone Encounter (Signed)
SWCM mailed adolescent transition dismissal letter to pt, marked dismissed  from practice in pt's chart due to age. Can be seen by [facility] until age 20 if needed.   Chauntay Paszkiewicz, BSW, QP Case Manager [facility] Office: [phone] Direct Number: [phone]  

## 2023-12-30 ENCOUNTER — Encounter: Payer: Self-pay | Admitting: Nurse Practitioner

## 2023-12-30 ENCOUNTER — Ambulatory Visit (INDEPENDENT_AMBULATORY_CARE_PROVIDER_SITE_OTHER): Admitting: Nurse Practitioner

## 2023-12-30 VITALS — BP 130/67 | HR 72 | Temp 97.0°F | Ht 70.0 in | Wt 197.0 lb

## 2023-12-30 DIAGNOSIS — Z1321 Encounter for screening for nutritional disorder: Secondary | ICD-10-CM

## 2023-12-30 DIAGNOSIS — Z23 Encounter for immunization: Secondary | ICD-10-CM | POA: Insufficient documentation

## 2023-12-30 DIAGNOSIS — Z13228 Encounter for screening for other metabolic disorders: Secondary | ICD-10-CM

## 2023-12-30 DIAGNOSIS — Z0000 Encounter for general adult medical examination without abnormal findings: Secondary | ICD-10-CM | POA: Insufficient documentation

## 2023-12-30 DIAGNOSIS — Z1329 Encounter for screening for other suspected endocrine disorder: Secondary | ICD-10-CM | POA: Diagnosis not present

## 2023-12-30 DIAGNOSIS — Z130 Encounter for screening for diseases of the blood and blood-forming organs and certain disorders involving the immune mechanism: Secondary | ICD-10-CM | POA: Insufficient documentation

## 2023-12-30 NOTE — Assessment & Plan Note (Signed)
Patient educated on CDC recommendation for the vaccine. Verbal consent was obtained from the patient, vaccine administered by nurse, no sign of adverse reactions noted at this time. Patient education on arm soreness and use of tylenol orfor this patient  was discussed. Patient educated on the signs and symptoms of adverse effect and advise to contact the office if they occur 

## 2023-12-30 NOTE — Assessment & Plan Note (Signed)
 Annual exam as documented.  Counseling done include healthy lifestyle involving committing to 150 minutes of exercise per week, heart healthy diet, and attaining healthy weight. The importance of adequate sleep also discussed.  Regular use of seat belt and home safety were also discussed . Immunization and cancer screening  needs are specifically addressed at this visit.    1. Need for Tdap vaccination (Primary)  - Tdap vaccine greater than or equal to 25yo IM  . Screening for endocrine, nutritional, metabolic and immunity disorder  - Lipid panel; Future - CBC; Future - CMP14+EGFR; Future - Hepatitis C antibody; Future

## 2023-12-30 NOTE — Patient Instructions (Signed)
 1. Need for Tdap vaccination (Primary)   2. Annual physical exam   3. Screening for endocrine, nutritional, metabolic and immunity disorder  - Lipid panel; Future - CBC; Future - CMP14+EGFR; Future - Hepatitis C antibody; Future      It is important that you exercise regularly at least 30 minutes 5 times a week as tolerated  Think about what you will eat, plan ahead. Choose " clean, green, fresh or frozen" over canned, processed or packaged foods which are more sugary, salty and fatty. 70 to 75% of food eaten should be vegetables and fruit. Three meals at set times with snacks allowed between meals, but they must be fruit or vegetables. Aim to eat over a 12 hour period , example 7 am to 7 pm, and STOP after  your last meal of the day. Drink water,generally about 64 ounces per day, no other drink is as healthy. Fruit juice is best enjoyed in a healthy way, by EATING the fruit.  Thanks for choosing [facility] we consider it a privelige to serve you.

## 2023-12-30 NOTE — Progress Notes (Signed)
 Complete physical exam  Patient: Harold Fernandez   DOB: 07-Mar-2001   22 y.o. Male  MRN: [id number]  Subjective:    Chief Complaint  Patient presents with   Establish Care    Harold Fernandez is a 23 y.o. male  has a past medical history of Allergic rhinitis (2006) and Wheezing (09/2004). who presents today for a complete physical exam and to establish care. He reports consuming a general diet goes to the GYM 4 times a week  He generally feels fairly well. He reports sleeping well.   Previous PCP Clifton Custard, MD   Most recent fall risk assessment:     No data to display           Most recent depression screenings:    12/30/2023   11:24 AM 02/28/2018    3:26 PM  PHQ 2/9 Scores  PHQ - 2 Score 0 0  PHQ- 9 Score  0        Patient Care Team: Donell Beers, FNP as PCP - General (Nurse Practitioner)   Outpatient Medications Prior to Visit  Medication Sig   acetaminophen (TYLENOL) 500 MG tablet Take 500 mg by mouth every 6 (six) hours as needed.   [DISCONTINUED] clindamycin-benzoyl peroxide (BENZACLIN) gel Apply topically at bedtime. (Patient not taking: Reported on 03/13/2019)   [DISCONTINUED] diclofenac (VOLTAREN) 75 MG EC tablet Take 1 tablet (75 mg total) by mouth 2 (two) times daily. (Patient not taking: Reported on 03/13/2019)   [DISCONTINUED] ibuprofen (ADVIL,MOTRIN) 600 MG tablet Take one tablet every 6 hours while awake for the next 3-4 days and then prn for pain (Patient not taking: Reported on 10/29/2018)   No facility-administered medications prior to visit.    Review of Systems  Constitutional:  Negative for appetite change, chills, fatigue and fever.  HENT:  Negative for congestion, postnasal drip, rhinorrhea and sneezing.   Eyes:  Negative for pain, discharge and itching.  Respiratory:  Negative for cough, shortness of breath and wheezing.   Cardiovascular:  Negative for chest pain, palpitations and leg swelling.  Gastrointestinal:  Negative  for abdominal pain, constipation, nausea and vomiting.  Endocrine: Negative for cold intolerance, heat intolerance and polydipsia.  Genitourinary:  Negative for difficulty urinating, dysuria, flank pain and frequency.  Musculoskeletal:  Negative for arthralgias, back pain, joint swelling and myalgias.  Skin:  Negative for color change, pallor, rash and wound.  Neurological:  Negative for dizziness, facial asymmetry, weakness, numbness and headaches.  Psychiatric/Behavioral:  Negative for behavioral problems, confusion, self-injury and suicidal ideas.        Objective:     BP 130/67   Pulse 72   Temp (!) 97 F (36.1 C)   Ht 5\' 10"  (1.778 m)   Wt 197 lb (89.4 kg)   SpO2 98%   BMI 28.27 kg/m    Physical Exam Vitals and nursing note reviewed.  Constitutional:      General: He is not in acute distress.    Appearance: Normal appearance. He is not ill-appearing, toxic-appearing or diaphoretic.  HENT:     Right Ear: Tympanic membrane, ear canal and external ear normal. There is no impacted cerumen.     Left Ear: Tympanic membrane, ear canal and external ear normal. There is no impacted cerumen.     Nose: Nose normal. No congestion or rhinorrhea.     Mouth/Throat:     Mouth: Mucous membranes are moist.     Pharynx: Oropharynx is clear. No oropharyngeal exudate or posterior  oropharyngeal erythema.  Eyes:     General: No scleral icterus.       Right eye: No discharge.        Left eye: No discharge.     Extraocular Movements: Extraocular movements intact.     Conjunctiva/sclera: Conjunctivae normal.  Neck:     Vascular: No carotid bruit.  Cardiovascular:     Rate and Rhythm: Normal rate and regular rhythm.     Pulses: Normal pulses.     Heart sounds: Normal heart sounds. No murmur heard.    No friction rub. No gallop.  Pulmonary:     Effort: Pulmonary effort is normal. No respiratory distress.     Breath sounds: Normal breath sounds. No stridor. No wheezing, rhonchi or rales.   Chest:     Chest wall: No tenderness.  Abdominal:     General: Bowel sounds are normal. There is no distension.     Palpations: Abdomen is soft. There is no mass.     Tenderness: There is no abdominal tenderness. There is no right CVA tenderness, left CVA tenderness, guarding or rebound.     Hernia: No hernia is present.  Musculoskeletal:        General: No swelling, tenderness, deformity or signs of injury.     Cervical back: Normal range of motion and neck supple. No rigidity or tenderness.     Right lower leg: No edema.     Left lower leg: No edema.  Lymphadenopathy:     Cervical: No cervical adenopathy.  Skin:    General: Skin is warm and dry.     Capillary Refill: Capillary refill takes less than 2 seconds.     Coloration: Skin is not jaundiced or pale.     Findings: No bruising, erythema, lesion or rash.  Neurological:     Mental Status: He is alert and oriented to person, place, and time.     Cranial Nerves: No cranial nerve deficit.     Sensory: No sensory deficit.     Motor: No weakness.     Coordination: Coordination normal.     Gait: Gait normal.     Deep Tendon Reflexes: Reflexes normal.  Psychiatric:        Mood and Affect: Mood normal.        Behavior: Behavior normal.        Thought Content: Thought content normal.        Judgment: Judgment normal.     No results found for any visits on 12/30/23.     Assessment & Plan:    Routine Health Maintenance and Physical Exam  Immunization History  Administered Date(s) Administered   DTaP 10/10/1999, 11/28/1999, 10/22/2000, 01/29/2001, 08/09/2003   H1N1 07/31/2008, 09/04/2008   HIB (PRP-OMP) 10/10/1999, 11/28/1999, 01/30/2000, 10/22/2000   HPV Quadrivalent 01/08/2012, 03/10/2012, 07/11/2012   Hepatitis A 08/22/2006, 09/15/2007   Hepatitis B 11/06/1998, 10/10/1999, 04/29/2000   IPV 10/10/1999, 11/28/1999, 08/06/2000, 08/09/2003   Influenza,Quad,Nasal, Live 07/06/2013   Influenza,inj,Quad PF,6+ Mos 07/20/2015,  07/23/2016, 07/24/2017, 07/02/2018, 06/19/2019   Influenza,inj,quad, With Preservative 06/30/2014   Influenza-Unspecified 08/21/2005, 09/15/2007, 07/31/2008, 07/23/2009, 07/15/2010, 07/07/2011, 07/05/2012   MMR 08/06/2000, 08/09/2003   Meningococcal Conjugate 11/01/2010, 02/09/2016   Pneumococcal-Unspecified 10/10/1999, 11/28/1999, 01/30/2000, 08/06/2000   Td 11/01/2010   Tdap 11/01/2010   Varicella 08/06/2000, 08/21/2005    Health Maintenance  Topic Date Due   Hepatitis C Screening  Never done   DTaP/Tdap/Td (7 - Td or Tdap) 11/01/2020   COVID-19 Vaccine (1 - 2024-25 season)  Never done   INFLUENZA VACCINE  04/24/2024   HPV VACCINES  Completed   HIV Screening  Completed    Discussed health benefits of physical activity, and encouraged him to engage in regular exercise appropriate for his age and condition.  Problem List Items Addressed This Visit       Other   Need for Tdap vaccination   Patient educated on CDC recommendation for the vaccine. Verbal consent was obtained from the patient, vaccine administered by nurse, no sign of adverse reactions noted at this time. Patient education on arm soreness and use of tylenol or  for this patient  was discussed. Patient educated on the signs and symptoms of adverse effect and advise to contact the office if they occur.       Relevant Orders   Tdap vaccine greater than or equal to 7yo IM   Annual physical exam - Primary   Annual exam as documented.  Counseling done include healthy lifestyle involving committing to 150 minutes of exercise per week, heart healthy diet, and attaining healthy weight. The importance of adequate sleep also discussed.  Regular use of seat belt and home safety were also discussed . Immunization and cancer screening  needs are specifically addressed at this visit.    1. Need for Tdap vaccination (Primary)  - Tdap vaccine greater than or equal to 7yo IM  . Screening for endocrine, nutritional, metabolic and  immunity disorder  - Lipid panel; Future - CBC; Future - CMP14+EGFR; Future - Hepatitis C antibody; Future        Screening for endocrine, nutritional, metabolic and immunity disorder   Relevant Orders   Lipid panel   CBC   CMP14+EGFR   Hepatitis C antibody   Return in about 1 year (around 12/29/2024) for FASTING LABS THIS WEEK, CPE.     Donell Beers, FNP

## 2024-01-02 ENCOUNTER — Other Ambulatory Visit: Payer: Self-pay

## 2024-01-02 DIAGNOSIS — Z130 Encounter for screening for diseases of the blood and blood-forming organs and certain disorders involving the immune mechanism: Secondary | ICD-10-CM

## 2024-01-03 LAB — CMP14+EGFR
ALT: 61 IU/L — ABNORMAL HIGH (ref 0–44)
AST: 37 IU/L (ref 0–40)
Albumin: 4.6 g/dL (ref 4.3–5.2)
Alkaline Phosphatase: 104 IU/L (ref 44–121)
BUN/Creatinine Ratio: 11 (ref 9–20)
BUN: 12 mg/dL (ref 6–20)
Bilirubin Total: 0.5 mg/dL (ref 0.0–1.2)
CO2: 23 mmol/L (ref 20–29)
Calcium: 10.1 mg/dL (ref 8.7–10.2)
Chloride: 102 mmol/L (ref 96–106)
Creatinine, Ser: 1.1 mg/dL (ref 0.76–1.27)
Globulin, Total: 2.8 g/dL (ref 1.5–4.5)
Glucose: 88 mg/dL (ref 70–99)
Potassium: 4.7 mmol/L (ref 3.5–5.2)
Sodium: 140 mmol/L (ref 134–144)
Total Protein: 7.4 g/dL (ref 6.0–8.5)
eGFR: 96 mL/min/{1.73_m2} (ref >59)

## 2024-01-03 LAB — HEPATITIS C ANTIBODY: Hep C Virus Ab: NONREACTIVE

## 2024-01-03 LAB — CBC
Hematocrit: 47.9 % (ref 37.5–51.0)
Hemoglobin: 16.1 g/dL (ref 13.0–17.7)
MCH: 30.5 pg (ref 26.6–33.0)
MCHC: 33.6 g/dL (ref 31.5–35.7)
MCV: 91 fL (ref 79–97)
Platelets: 263 10*3/uL (ref 150–450)
RBC: 5.28 x10E6/uL (ref 4.14–5.80)
RDW: 12.3 % (ref 11.6–15.4)
WBC: 5.4 10*3/uL (ref 3.4–10.8)

## 2024-01-03 LAB — LIPID PANEL
Chol/HDL Ratio: 3.4 ratio (ref 0.0–5.0)
Cholesterol, Total: 149 mg/dL (ref 100–199)
HDL: 44 mg/dL (ref >39)
LDL Chol Calc (NIH): 87 mg/dL (ref 0–99)
Triglycerides: 96 mg/dL (ref 0–149)
VLDL Cholesterol Cal: 18 mg/dL (ref 5–40)

## 2024-01-13 ENCOUNTER — Ambulatory Visit: Payer: Managed Care, Other (non HMO) | Admitting: Family Medicine

## 2024-01-13 NOTE — Telephone Encounter (Unsigned)
 Copied from CRM [id number]. Topic: Clinical - Lab/Test Results >> Jan 13, 2024  2:55 PM Alysia Jumbo S wrote: Reason for CRM: Patient calling to review lab results. Relayed results verbatim, no additional questions at this time.

## 2024-02-24 ENCOUNTER — Ambulatory Visit: Admitting: Family Medicine

## 2024-12-29 ENCOUNTER — Encounter: Payer: Self-pay | Admitting: Nurse Practitioner
